# Patient Record
Sex: Male | Born: 1940 | Race: White | Hispanic: No | Marital: Married | Smoking: Former smoker
Health system: Southern US, Community
[De-identification: ages and names within clinical notes are randomized; demographics above are authoritative.]

## PROBLEM LIST (undated history)

## (undated) DIAGNOSIS — E539 Vitamin B deficiency, unspecified: Secondary | ICD-10-CM

## (undated) DIAGNOSIS — E538 Deficiency of other specified B group vitamins: Secondary | ICD-10-CM

## (undated) DIAGNOSIS — F419 Anxiety disorder, unspecified: Secondary | ICD-10-CM

## (undated) DIAGNOSIS — E7800 Pure hypercholesterolemia, unspecified: Secondary | ICD-10-CM

## (undated) DIAGNOSIS — I10 Essential (primary) hypertension: Secondary | ICD-10-CM

## (undated) DIAGNOSIS — K219 Gastro-esophageal reflux disease without esophagitis: Secondary | ICD-10-CM

## (undated) HISTORY — PX: CATARACT EXTRACTION: SUR2

---

## 2007-12-13 ENCOUNTER — Inpatient Hospital Stay: Payer: Self-pay | Admitting: Internal Medicine

## 2007-12-13 ENCOUNTER — Other Ambulatory Visit: Payer: Self-pay

## 2009-05-12 IMAGING — CR DG CHEST 1V PORT
1 series · 1 of 1 positions shown · non-contrast
Comparison: none

REASON FOR EXAM: Chest Pain
COMMENTS:

PROCEDURE:     DXR - DXR PORTABLE CHEST SINGLE VIEW  - December 13, 2007  [DATE]
RESULT:     Comparison: No available comparison exam.

[view not recorded]
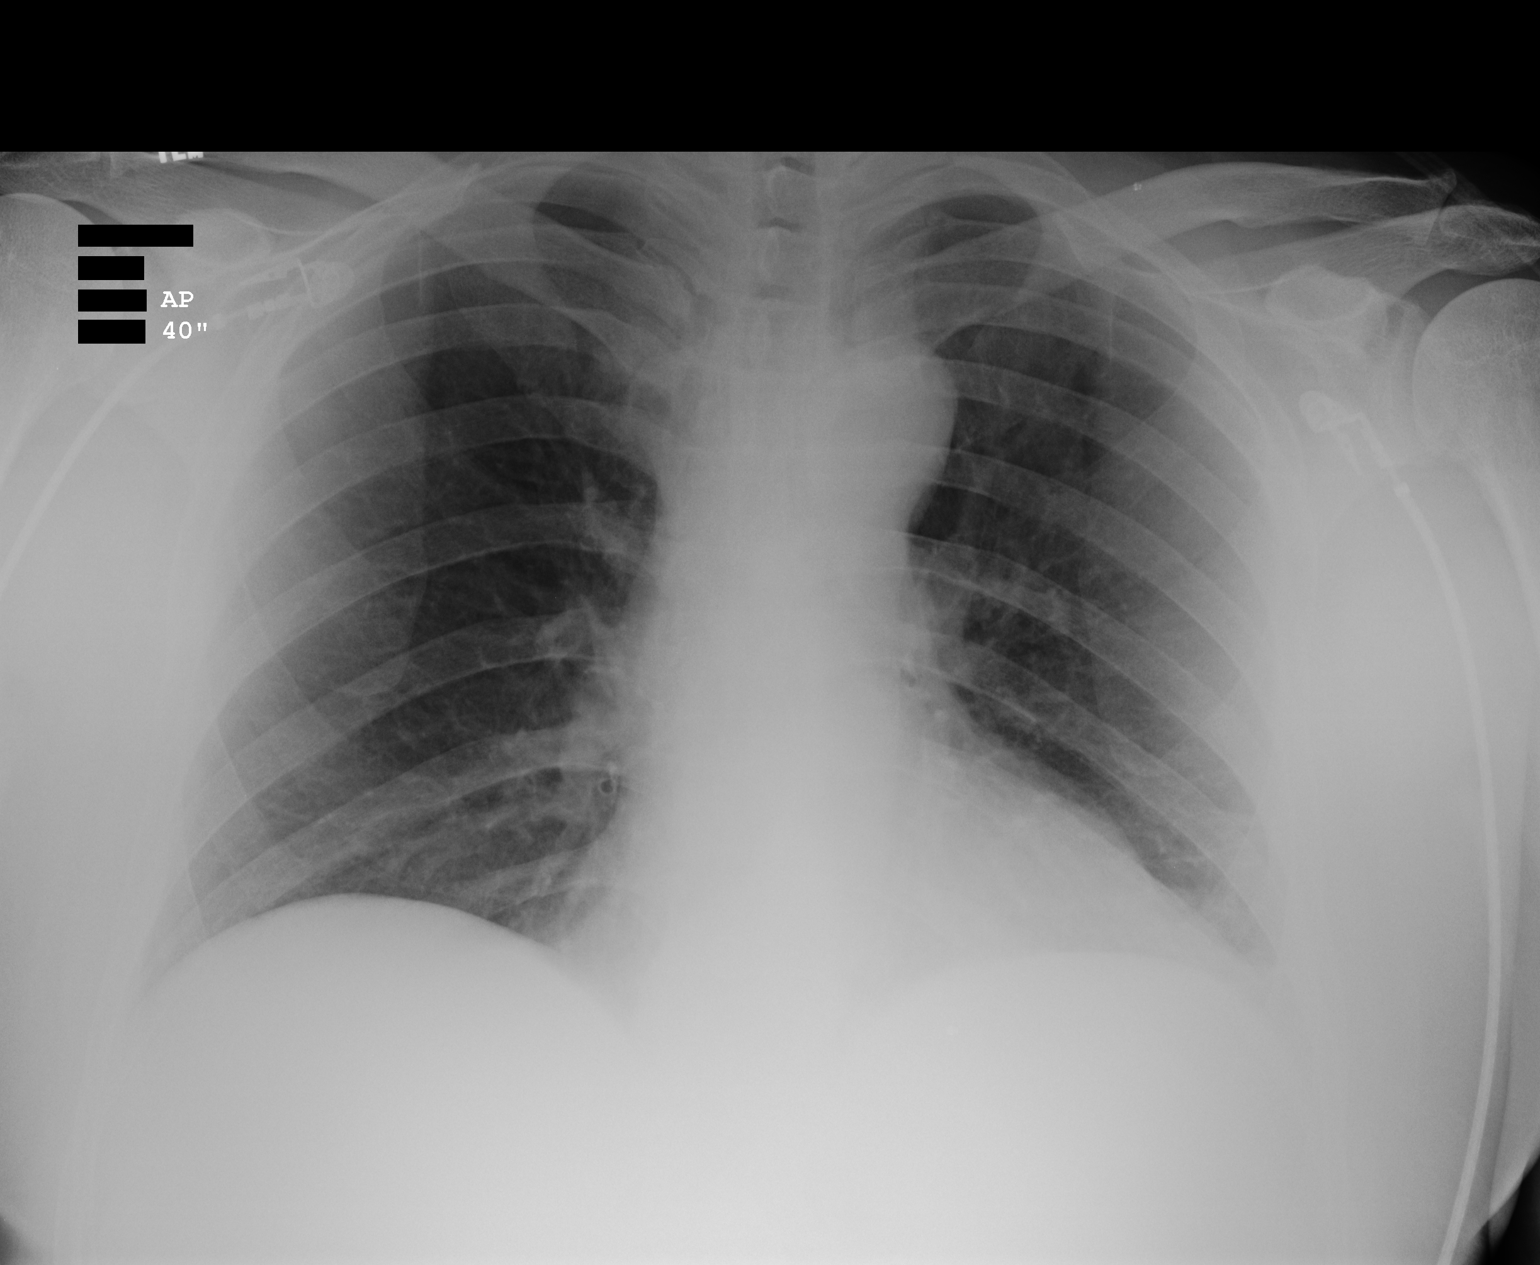

[1 of 1 positions shown; findings below may reference images not displayed]

FINDINGS: There is no significant pulmonary consolidation, pulmonary edema, pleural
effusion, nor pneumothorax. The cardiomediastinal silhouette is within
normal limits.

No grossly displaced rib fracture is noted.
IMPRESSION: 1. No acute cardiopulmonary abnormality is noted.

## 2011-04-28 ENCOUNTER — Ambulatory Visit: Payer: Self-pay | Admitting: Family Medicine

## 2015-05-08 ENCOUNTER — Ambulatory Visit
Admission: EM | Admit: 2015-05-08 | Discharge: 2015-05-08 | Disposition: A | Discharge status: Home / Self Care | Payer: Medicare Other | Attending: Family Medicine | Admitting: Family Medicine

## 2015-05-08 ENCOUNTER — Ambulatory Visit: Payer: Medicare Other

## 2015-05-08 ENCOUNTER — Encounter: Payer: Self-pay | Admitting: Emergency Medicine

## 2015-05-08 DIAGNOSIS — J0100 Acute maxillary sinusitis, unspecified: Secondary | ICD-10-CM | POA: Diagnosis not present

## 2015-05-08 DIAGNOSIS — J40 Bronchitis, not specified as acute or chronic: Secondary | ICD-10-CM | POA: Diagnosis not present

## 2015-05-08 HISTORY — DX: Essential (primary) hypertension: I10

## 2015-05-08 HISTORY — DX: Gastro-esophageal reflux disease without esophagitis: K21.9

## 2015-05-08 HISTORY — DX: Deficiency of other specified B group vitamins: E53.8

## 2015-05-08 HISTORY — DX: Pure hypercholesterolemia, unspecified: E78.00

## 2015-05-08 HISTORY — DX: Vitamin B deficiency, unspecified: E53.9

## 2015-05-08 HISTORY — DX: Anxiety disorder, unspecified: F41.9

## 2015-05-08 MED ORDER — BENZONATATE 100 MG PO CAPS: 100.0000 mg | ORAL_CAPSULE | Freq: Three times a day (TID) | ORAL | Status: AC | PRN

## 2015-05-08 MED ORDER — AZITHROMYCIN 250 MG PO TABS: ORAL_TABLET | ORAL | Status: AC

## 2015-05-08 NOTE — ED Notes (Signed)
Patient c/o sinus congestion and pressure and cough and chest congestion for 2 weeks.

## 2015-05-08 NOTE — ED Provider Notes (Signed)
[facility] Emergency Department Provider Note  ____________________________________________  Time seen: Approximately 8:18 AM  I have reviewed the triage vital signs and the nursing notes.   HISTORY  Chief Complaint Facial Pain and Cough   HPI Malik Roberts is a 74 y.o. male presents for cough, congestion x 2 weeks. Reports spouse with similar.  States granddaughters boyfriend with similar prior to onset of symptoms for patient. Reports runny nose, nasal congestion and sinus pressure and over last few days with more nasal drainage and coughing. States occasional gets greenish phlegm from nose and cough. Denies chest pain, shortness of breath, weakness, dizziness, abdominal pain. Reports continues to eat and drink well. Denies known fever.   Past Medical History  Diagnosis Date  . Vitamin B deficiency   . Hypertension   . GERD (gastroesophageal reflux disease)   . Hypercholesteremia   . Anxiety   . Vitamin B12 deficiency   Chronic back pain Incomplete emptying of bladder   There are no active problems to display for this patient. PCP: Heard/Tobin  Past Surgical History  Procedure Laterality Date  . Cataract extraction Bilateral     Current Outpatient Rx  Name  Route  Sig  Dispense  Refill  . aspirin 81 MG tablet   Oral   Take 81 mg by mouth daily.         Marland Kitchen lisinopril (PRINIVIL,ZESTRIL) 10 MG tablet   Oral   Take 10 mg by mouth daily.         Marland Kitchen LORazepam (ATIVAN) 1 MG tablet   Oral   Take 1 mg by mouth every 8 (eight) hours as needed for anxiety.         . pantoprazole (PROTONIX) 40 MG tablet   Oral   Take 40 mg by mouth daily.         . silodosin (RAPAFLO) 4 MG CAPS capsule   Oral   Take 4 mg by mouth daily with breakfast.         . tadalafil (CIALIS) 5 MG tablet   Oral   Take 5 mg by mouth daily as needed for erectile dysfunction.           Allergies Doxycycline  History reviewed. No pertinent family  history.  Social History Social History  Substance Use Topics  . Smoking status: Former Games developer  . Smokeless tobacco: None  . Alcohol Use: No    Review of Systems Constitutional: No fever/chills Eyes: No visual changes. ENT: positive for nasal congestion and intermittent cough Cardiovascular: Denies chest pain. Respiratory: Denies shortness of breath. Gastrointestinal: No abdominal pain.  No nausea, no vomiting.  No diarrhea.  No constipation. Genitourinary: Negative for dysuria. Musculoskeletal: Negative for back pain. Skin: Negative for rash. Neurological: Negative for headaches, focal weakness or numbness.  10-point ROS otherwise negative.  ____________________________________________   PHYSICAL EXAM:  VITAL SIGNS: ED Triage Vitals  Enc Vitals Group     BP --      Pulse --      Resp --      Temp --      Temp src --      SpO2 --      Weight --      Height --      Head Cir --      Peak Flow --      Pain Score --      Pain Loc --      Pain Edu? --  Excl. in GC? --   Blood pressure 125/70, pulse 67, temperature 97.5 F (36.4 C), temperature source Tympanic, resp. rate 16, height 6' (1.829 m), weight 178 lb (80.74 kg), SpO2 98 %.   Constitutional: Alert and oriented. Well appearing and in no acute distress. Eyes: Conjunctivae are normal. PERRL. EOMI. Head: Atraumatic. Mild maxillary sinus TTP, no frontal sinus TTP.    Ears: no erythema, normal TMs bilaterally.   Nose: nasal congestion, bilateral nasal turbinate edema, bilateral nares patent.   Mouth/Throat: Mucous membranes are moist.  Oropharynx non-erythematous. Neck: No stridor.  No cervical spine tenderness to palpation. Hematological/Lymphatic/Immunilogical: No cervical lymphadenopathy. Cardiovascular: Normal rate, regular rhythm. Grossly normal heart sounds.  Good peripheral circulation. Respiratory: Normal respiratory effort.  No retractions. Lungs CTAB. Dry intermittent cough in room.   Gastrointestinal: Soft and nontender. No distention. Normal Bowel sounds.  No abdominal bruits. No CVA tenderness. Musculoskeletal: No lower or upper extremity tenderness nor edema.  No joint effusions. Bilateral pedal pulses equal and easily palpated.  Neurologic:  Normal speech and language. No gross focal neurologic deficits are appreciated. No gait instability. Skin:  Skin is warm, dry and intact. No rash noted. Psychiatric: Mood and affect are normal. Speech and behavior are normal.  ____________________________________________   LABS (all labs ordered are listed, but only abnormal results are displayed)  Labs Reviewed - No data to display ____________________________________________  RADIOLOGY  EXAM: CHEST 2 VIEW  COMPARISON: [facility] Portable chest radiograph 12/13/2007  FINDINGS: Improved lung volumes. Normal cardiac size and mediastinal contours. Visualized tracheal air column is within normal limits. No pneumothorax, pulmonary edema, pleural effusion or confluent pulmonary opacity. Attenuation of bronchovascular markings in the upper lobes raising the possibility of emphysema. Osteopenia. No acute osseous abnormality identified.  IMPRESSION: Possible emphysema. No acute cardiopulmonary abnormality.   Electronically Signed By: Odessa Fleming M.D. On: 05/08/2015 09:50 I, [name], personally viewed and evaluated these images (plain radiographs) as part of my medical decision making.   ____________________________________________  INITIAL IMPRESSION / ASSESSMENT AND PLAN / ED COURSE  Pertinent labs & imaging results that were available during my care of the patient were reviewed by me and considered in my medical decision making (see chart for details).  Well appearing. No acute distress. Presents for runny nose, congestion, post nasal drainage and intermittent cough x 2 weeks, wife with similar. Denies chest pain, shortness of  breath, decreased oral intake, weakness, abdominal pain or other complaints.   Chest xray negative for acute cardiopulmonary disease. Will treat with oral azithromycin, prn tessalon perles. Discussed strict follow up and return parameters. Follow up with PCP next week. Patient verbalized understanding and agreed to plan.    ____________________________________________   FINAL CLINICAL IMPRESSION(S) / ED DIAGNOSES  Final diagnoses:  Acute maxillary sinusitis, recurrence not specified  Bronchitis       [name], NP 05/08/15 1046

## 2015-05-08 NOTE — Discharge Instructions (Signed)
Take medication as prescribed. Rest. Follow up with your primary care physician next week. Return to Urgent care for new or worsening concerns.   Sinusitis Sinusitis is redness, soreness, and inflammation of the paranasal sinuses. Paranasal sinuses are air pockets within the bones of your face (beneath the eyes, the middle of the forehead, or above the eyes). In healthy paranasal sinuses, mucus is able to drain out, and air is able to circulate through them by way of your nose. However, when your paranasal sinuses are inflamed, mucus and air can become trapped. This can allow bacteria and other germs to grow and cause infection. Sinusitis can develop quickly and last only a short time (acute) or continue over a long period (chronic). Sinusitis that lasts for more than 12 weeks is considered chronic.  CAUSES  Causes of sinusitis include:  Allergies.  Structural abnormalities, such as displacement of the cartilage that separates your nostrils (deviated septum), which can decrease the air flow through your nose and sinuses and affect sinus drainage.  Functional abnormalities, such as when the small hairs (cilia) that line your sinuses and help remove mucus do not work properly or are not present. SIGNS AND SYMPTOMS  Symptoms of acute and chronic sinusitis are the same. The primary symptoms are pain and pressure around the affected sinuses. Other symptoms include:  Upper toothache.  Earache.  Headache.  Bad breath.  Decreased sense of smell and taste.  A cough, which worsens when you are lying flat.  Fatigue.  Fever.  Thick drainage from your nose, which often is green and may contain pus (purulent).  Swelling and warmth over the affected sinuses. DIAGNOSIS  Your health care provider will perform a physical exam. During the exam, your health care provider may:  Look in your nose for signs of abnormal growths in your nostrils (nasal polyps).  Tap over the affected sinus to check  for signs of infection.  View the inside of your sinuses (endoscopy) using an imaging device that has a light attached (endoscope). If your health care provider suspects that you have chronic sinusitis, one or more of the following tests may be recommended:  Allergy tests.  Nasal culture. A sample of mucus is taken from your nose, sent to a lab, and screened for bacteria.  Nasal cytology. A sample of mucus is taken from your nose and examined by your health care provider to determine if your sinusitis is related to an allergy. TREATMENT  Most cases of acute sinusitis are related to a viral infection and will resolve on their own within 10 days. Sometimes medicines are prescribed to help relieve symptoms (pain medicine, decongestants, nasal steroid sprays, or saline sprays).  However, for sinusitis related to a bacterial infection, your health care provider will prescribe antibiotic medicines. These are medicines that will help kill the bacteria causing the infection.  Rarely, sinusitis is caused by a fungal infection. In theses cases, your health care provider will prescribe antifungal medicine. For some cases of chronic sinusitis, surgery is needed. Generally, these are cases in which sinusitis recurs more than 3 times per year, despite other treatments. HOME CARE INSTRUCTIONS   Drink plenty of water. Water helps thin the mucus so your sinuses can drain more easily.  Use a humidifier.  Inhale steam 3 to 4 times a day (for example, sit in the bathroom with the shower running).  Apply a warm, moist washcloth to your face 3 to 4 times a day, or as directed by your health care  provider.  Use saline nasal sprays to help moisten and clean your sinuses.  Take medicines only as directed by your health care provider.  If you were prescribed either an antibiotic or antifungal medicine, finish it all even if you start to feel better. SEEK IMMEDIATE MEDICAL CARE IF:  You have increasing pain or  severe headaches.  You have nausea, vomiting, or drowsiness.  You have swelling around your face.  You have vision problems.  You have a stiff neck.  You have difficulty breathing. MAKE SURE YOU:   Understand these instructions.  Will watch your condition.  Will get help right away if you are not doing well or get worse. Document Released: 08/16/2005 Document Revised: 12/31/2013 Document Reviewed: 08/31/2011 [facility] Patient Information 2015 [place], [place]. This information is not intended to replace advice given to you by your health care provider. Make sure you discuss any questions you have with your health care provider.

## 2020-09-30 DEATH — deceased
